# Patient Record
Sex: Male | Born: 1970 | Hispanic: No | Marital: Married | Smoking: Current some day smoker
Health system: Southern US, Community
[De-identification: ages and names within clinical notes are randomized; demographics above are authoritative.]

## PROBLEM LIST (undated history)

## (undated) DIAGNOSIS — F419 Anxiety disorder, unspecified: Secondary | ICD-10-CM

## (undated) DIAGNOSIS — F32A Depression, unspecified: Secondary | ICD-10-CM

## (undated) DIAGNOSIS — F329 Major depressive disorder, single episode, unspecified: Secondary | ICD-10-CM

## (undated) HISTORY — PX: TOTAL HIP ARTHROPLASTY: SHX124

---

## 2004-02-16 ENCOUNTER — Other Ambulatory Visit: Payer: Self-pay

## 2004-05-16 ENCOUNTER — Other Ambulatory Visit: Payer: Self-pay

## 2005-06-22 ENCOUNTER — Other Ambulatory Visit: Payer: Self-pay

## 2005-06-22 ENCOUNTER — Emergency Department: Payer: Self-pay | Admitting: Emergency Medicine

## 2006-06-10 ENCOUNTER — Other Ambulatory Visit: Payer: Self-pay

## 2006-06-10 ENCOUNTER — Emergency Department: Payer: Self-pay | Admitting: Unknown Physician Specialty

## 2007-03-30 ENCOUNTER — Ambulatory Visit: Payer: Self-pay | Admitting: Family Medicine

## 2007-04-07 ENCOUNTER — Ambulatory Visit: Payer: Self-pay | Admitting: Family Medicine

## 2008-04-11 ENCOUNTER — Encounter: Payer: Self-pay | Admitting: Internal Medicine

## 2008-04-25 ENCOUNTER — Encounter: Payer: Self-pay | Admitting: Internal Medicine

## 2008-10-17 ENCOUNTER — Ambulatory Visit: Payer: Self-pay | Admitting: Internal Medicine

## 2008-10-17 ENCOUNTER — Ambulatory Visit: Payer: Self-pay | Admitting: Unknown Physician Specialty

## 2008-10-23 ENCOUNTER — Ambulatory Visit: Payer: Self-pay | Admitting: Unknown Physician Specialty

## 2009-01-29 ENCOUNTER — Ambulatory Visit: Payer: Self-pay | Admitting: Family Medicine

## 2010-02-18 ENCOUNTER — Observation Stay: Payer: Self-pay | Admitting: Internal Medicine

## 2010-02-27 ENCOUNTER — Emergency Department: Payer: Self-pay | Admitting: Unknown Physician Specialty

## 2010-03-01 ENCOUNTER — Inpatient Hospital Stay: Payer: Self-pay | Admitting: Psychiatry

## 2010-03-05 ENCOUNTER — Ambulatory Visit: Payer: Self-pay | Admitting: Unknown Physician Specialty

## 2010-03-13 ENCOUNTER — Ambulatory Visit: Payer: Self-pay | Admitting: Internal Medicine

## 2010-03-16 ENCOUNTER — Inpatient Hospital Stay: Payer: Self-pay | Admitting: Internal Medicine

## 2010-03-18 ENCOUNTER — Inpatient Hospital Stay: Payer: Self-pay | Admitting: Unknown Physician Specialty

## 2010-03-22 ENCOUNTER — Ambulatory Visit: Payer: Self-pay | Admitting: Unknown Physician Specialty

## 2010-03-25 ENCOUNTER — Ambulatory Visit: Payer: Self-pay | Admitting: Unknown Physician Specialty

## 2010-04-25 ENCOUNTER — Ambulatory Visit: Payer: Self-pay | Admitting: Unknown Physician Specialty

## 2010-05-25 ENCOUNTER — Ambulatory Visit: Payer: Self-pay | Admitting: Unknown Physician Specialty

## 2010-06-25 ENCOUNTER — Ambulatory Visit: Payer: Self-pay | Admitting: Unknown Physician Specialty

## 2013-03-15 ENCOUNTER — Ambulatory Visit: Payer: Self-pay

## 2013-10-07 ENCOUNTER — Inpatient Hospital Stay: Payer: Self-pay | Admitting: Psychiatry

## 2013-10-07 LAB — CBC
HCT: 50.4 % (ref 40.0–52.0)
HGB: 17.5 g/dL (ref 13.0–18.0)
MCH: 32.2 pg (ref 26.0–34.0)
MCHC: 34.8 g/dL (ref 32.0–36.0)
MCV: 93 fL (ref 80–100)
Platelet: 195 10*3/uL (ref 150–440)
RBC: 5.43 10*6/uL (ref 4.40–5.90)
RDW: 13.1 % (ref 11.5–14.5)
WBC: 11.6 10*3/uL — AB (ref 3.8–10.6)

## 2013-10-07 LAB — DRUG SCREEN, URINE
AMPHETAMINES, UR SCREEN: NEGATIVE (ref ?–1000)
BENZODIAZEPINE, UR SCRN: NEGATIVE (ref ?–200)
Barbiturates, Ur Screen: NEGATIVE (ref ?–200)
Cannabinoid 50 Ng, Ur ~~LOC~~: NEGATIVE (ref ?–50)
Cocaine Metabolite,Ur ~~LOC~~: NEGATIVE (ref ?–300)
MDMA (Ecstasy)Ur Screen: NEGATIVE (ref ?–500)
METHADONE, UR SCREEN: NEGATIVE (ref ?–300)
Opiate, Ur Screen: POSITIVE (ref ?–300)
Phencyclidine (PCP) Ur S: NEGATIVE (ref ?–25)
Tricyclic, Ur Screen: NEGATIVE (ref ?–1000)

## 2013-10-07 LAB — URINALYSIS, COMPLETE
BILIRUBIN, UR: NEGATIVE
Bacteria: NONE SEEN
Blood: NEGATIVE
Glucose,UR: NEGATIVE mg/dL (ref 0–75)
KETONE: NEGATIVE
LEUKOCYTE ESTERASE: NEGATIVE
Nitrite: NEGATIVE
PH: 5 (ref 4.5–8.0)
Protein: 30
RBC, UR: NONE SEEN /HPF (ref 0–5)
Specific Gravity: 1.029 (ref 1.003–1.030)
Squamous Epithelial: NONE SEEN

## 2013-10-07 LAB — COMPREHENSIVE METABOLIC PANEL
ALBUMIN: 4.1 g/dL (ref 3.4–5.0)
AST: 21 U/L (ref 15–37)
Alkaline Phosphatase: 117 U/L
Anion Gap: 7 (ref 7–16)
BUN: 18 mg/dL (ref 7–18)
Bilirubin,Total: 0.7 mg/dL (ref 0.2–1.0)
CHLORIDE: 101 mmol/L (ref 98–107)
CREATININE: 1.22 mg/dL (ref 0.60–1.30)
Calcium, Total: 9.1 mg/dL (ref 8.5–10.1)
Co2: 28 mmol/L (ref 21–32)
EGFR (African American): >60
EGFR (Non-African Amer.): >60
GLUCOSE: 148 mg/dL — AB (ref 65–99)
Osmolality: 277 (ref 275–301)
Potassium: 3.8 mmol/L (ref 3.5–5.1)
SGPT (ALT): 42 U/L (ref 12–78)
Sodium: 136 mmol/L (ref 136–145)
TOTAL PROTEIN: 7.8 g/dL (ref 6.4–8.2)

## 2013-10-07 LAB — ETHANOL
Ethanol %: <0.003 % (ref 0.000–0.080)
Ethanol: <3 mg/dL

## 2013-10-07 LAB — ACETAMINOPHEN LEVEL

## 2013-10-07 LAB — SALICYLATE LEVEL: Salicylates, Serum: 3.6 mg/dL — ABNORMAL HIGH

## 2014-12-16 NOTE — Discharge Summary (Signed)
PATIENT NAME:  Allen Hogan, [name] MR#:  [id number] DATE OF BIRTH:  1971-01-04  DATE OF ADMISSION:  10/07/2013 DATE OF DISCHARGE:  10/10/2013  HOSPITAL COURSE: See dictated history and physical for details of admission. A 44 year old male with a history of major depression admitted to the hospital with return of severe depressive symptoms with suicidal ideation. In the hospital, the patient did not engage in any dangerous or aggressive behavior. He was cooperative with treatment and was treated with a combination of medicine and psychotherapy. The patient restarted his antidepressant. He was continued on medicines for his COPD and blood pressure and chronic pain. At the time of discharge, his mood was much better. He denied any suicidal ideation. He was very agreeable to follow-up treatment in the community. No sign of psychosis.   DISCHARGE MEDICATIONS: Hydrocodone/acetaminophen tablets 7.5 mg 1 tab 3 times a day as needed for pain, lisinopril 10 mg once a day, Risperdal 4 mg at night, clonazepam 1 mg per day, albuterol inhaler 2 puffs 4 times a day as needed for COPD, fluticasone/salmeterol inhaler 2 times a day for COPD, Prozac 40 mg per day.   MENTAL STATUS EXAM AT DISCHARGE: Neatly dressed and groomed man who looks his stated age, cooperative with the interview. Good eye contact, normal psychomotor activity. Speech normal rate, tone and volume. Affect euthymic, reactive, appropriate. Mood stated as okay. Thoughts lucid. No loosening of associations. Denies auditory or visual hallucinations. Denies suicidal or homicidal ideation. Short-term memory intact. Three out of three objects immediately at three minutes. Long-term memory grossly intact. Normal fund of knowledge. Alert and oriented x 4.   LABORATORY RESULTS: Labs on admission included a drug screen positive for opiates consistent with his medication. Alcohol level negative. Chemistry panel showed a glucose slightly high at 148 on a nonfasting draw.  Hematology panel: Elevated white count 11.6. Urinalysis unremarkable.   DISPOSITION: Discharge home. Follow up with [facility].   DIAGNOSIS, PRINCIPAL AND PRIMARY:  AXIS I: Major depression, recurrent, severe.   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  AXIS II: Deferred.  AXIS III: Chronic obstructive pulmonary disease, chronic pain.  AXIS IV: Severe acute stress from burden of illness.  AXIS V: Functioning at time of discharge: 55.   ____________________________ Audery AmelJohn T. Kamila Broda, MD jtc:sg D: 10/24/2013 23:17:23 ET T: 10/25/2013 08:31:24 ET JOB#: [id number]  cc: Audery AmelJohn T. Endiya Klahr, MD, <Dictator> Audery AmelJOHN T Kaedan Richert MD ELECTRONICALLY SIGNED 10/25/2013 9:59

## 2014-12-16 NOTE — H&P (Signed)
PATIENT NAME:  Allen Hogan, Allen Hogan MR#:  [id number] DATE OF BIRTH:  [birth year]  DATE OF ADMISSION:  10/07/2013  IDENTIFYING INFORMATION AND CHIEF COMPLAINT: A 44 year old man with a history of depression who came to the Emergency Room, referred from [facility].   CHIEF COMPLAINT: "I need to get out of this funky feeling."   HISTORY OF PRESENT ILLNESS: Information obtained from the patient and the chart. He says he is feeling extremely depressed. Mood is sad and down. Energy level is very poor. It has been going on for at least a month and getting progressively worse. He is not clear what might be making it worse although he thinks his current medication is not helping. He currently does not enjoy anything in his life and nothing is making him feel any better. His sleep is excessive and he is tired all the time. His appetite has been decreased. He has been having some fleeting suicidal thoughts without specific plan and intent.   Collateral information from his family suggests that he has been making more serious suicidal statements to them and they said that they would not be surprised if he were to shoot himself.   The patient says that currently he is not having auditory hallucinations, but he has had negative-sounding auditory hallucinations with his depression in the recent past. He denies that he is abusing any drugs or alcohol.   PAST PSYCHIATRIC HISTORY: He has had a previous hospitalization here in 2011 for suicidal ideation and behavior. At that time, he was also abusing cocaine. He says he has been off the drugs for about 2-1/2 years and still gets treatment for depression. He goes to [employer] and has had a community support team, although he is scheduled to stop getting that service soon.   SOCIAL HISTORY: He is married, lives with his wife and 72 year old daughter. The patient is not able to work and has applied for disability. He denies that he is having any problems getting along with his family.    FAMILY HISTORY: Had a brother who had a substance abuse problem and died of it.   PAST MEDICAL HISTORY: The patient reports being diagnosed with avascular necrosis of both hips, has chronic pain in them. Also has high blood pressure, has COPD, smokes still 1/2 pack per day.   REVIEW OF SYSTEMS: Depressed mood consistently all the time, getting worse. Made worse by fatigue. Current medication not helping. Also complains of constant tiredness. Chronic pain, especially in the hips, which is stable. He has suicidal ideation recently. Denies acute auditory or visual hallucinations. Has some chronic shortness of breath. No specific chest pain. No GI complaints. The rest of the 10-point review of systems is negative.   Overweight gentleman, looks older than his stated age. Cooperative with the interview. Eye contact good. Psychomotor activity slow and sluggish. Speech quiet, decreased in total amount. Still easy to understand, not slurred. Affect flattened, not tearful. Mood stated as depressed. Thoughts lucid. No obvious loosening of associations or delusions. Denies auditory or visual hallucinations. Endorses suicidal thoughts. No homicidal ideation. Short-term memory intact. Three out of three objects immediately and at three minutes. Long-term memory grossly intact, normal fund of knowledge. Judgment and insight maintained.   PHYSICAL EXAMINATION:  GENERAL: Overweight.  SKIN: No acute skin lesions.  HEENT: Pupils equal and reactive. Face symmetric. Oral mucosa dry.  NECK AND BACK: Not flexible, (Dictation Anomaly)<<neck hasMISSING TEXT>> poor range of motion in the neck. Tenderness throughout the back area to light palpation.  EXTREMITIES: Decreased range of motion especially in the hips and lower extremities. Fairly normal in the upper extremities. Strength and reflexes normal in upper extremities, slightly decreased bilateral symmetrically in the lower extremities.  NEUROLOGIC: Cranial nerves  symmetric and normal.  LUNGS: Diffusely wheezy, especially, however, on the left side worse than the right side.  ABDOMEN: Nontender. Normal bowel sounds.  HEART: Regular rate and rhythm.  VITAL SIGNS: Current temperature 98.1, pulse 80, respirations 18, blood pressure 150/97.   LABORATORY RESULTS: Drug screen positive for opiates consistent with his prescription medicine and alcohol level negative. Chemistry panel: Elevated glucose 148, otherwise unremarkable. CBC: Elevated white count 11.6. Urinalysis positive protein, no sign of infection. Salicylates slightly elevated at 3.6, not toxic. Acetaminophen negative.   ASSESSMENT: A 44 year old man with a history of major depression, severe, recurrent. Has cocaine dependence, in remission. Has chronic pain issues as well as high blood pressure, possible diabetes. The patient needs hospitalization because of suicidal ideation, past history of suicidal behavior, severity of depression.   TREATMENT PLAN: Admit to psychiatry. Place on suicide precautions. Switch medicines from his current Viibryd to Prozac 40 mg a day. Continue his pain medication, his inhalers, his blood pressure medicine, his aspirin, all as currently prescribed.   Try and get collateral history and involvement from the family if possible. Engage him in groups and activities on the unit for education and support. Continue to monitor mood for improvement.   DIAGNOSIS, PRINCIPAL AND PRIMARY:  AXIS I: Major depression, severe, recurrent.   SECONDARY DIAGNOSIS:  AXIS I: Cocaine dependence, in sustained remission.  AXIS II: No diagnosis.  AXIS III: Chronic pain, obesity, high blood pressure, chronic obstructive pulmonary disease.  AXIS IV: Severe from joblessness, effects of illness.  AXIS V: Functioning at time of evaluation, 30.   ____________________________ Audery AmelJohn T. Channelle Bottger, MD jtc:np D: 10/07/2013 17:17:23 ET T: 10/07/2013 18:21:25 ET JOB#: [id number]  cc: Audery AmelJohn T. Chantille Navarrete, MD,  <Dictator> Audery AmelJOHN T Breionna Punt MD ELECTRONICALLY SIGNED 10/11/2013 11:48

## 2016-07-24 ENCOUNTER — Ambulatory Visit
Admission: EM | Admit: 2016-07-24 | Discharge: 2016-07-24 | Disposition: A | Discharge status: Home / Self Care | Payer: Medicare Other | Attending: Emergency Medicine | Admitting: Emergency Medicine

## 2016-07-24 ENCOUNTER — Ambulatory Visit: Payer: Medicare Other

## 2016-07-24 DIAGNOSIS — J441 Chronic obstructive pulmonary disease with (acute) exacerbation: Secondary | ICD-10-CM

## 2016-07-24 DIAGNOSIS — R918 Other nonspecific abnormal finding of lung field: Secondary | ICD-10-CM | POA: Insufficient documentation

## 2016-07-24 DIAGNOSIS — J181 Lobar pneumonia, unspecified organism: Secondary | ICD-10-CM | POA: Diagnosis not present

## 2016-07-24 DIAGNOSIS — J189 Pneumonia, unspecified organism: Secondary | ICD-10-CM

## 2016-07-24 DIAGNOSIS — J449 Chronic obstructive pulmonary disease, unspecified: Secondary | ICD-10-CM | POA: Diagnosis not present

## 2016-07-24 DIAGNOSIS — R05 Cough: Secondary | ICD-10-CM | POA: Diagnosis not present

## 2016-07-24 HISTORY — DX: Major depressive disorder, single episode, unspecified: F32.9

## 2016-07-24 HISTORY — DX: Anxiety disorder, unspecified: F41.9

## 2016-07-24 HISTORY — DX: Depression, unspecified: F32.A

## 2016-07-24 MED ORDER — IPRATROPIUM-ALBUTEROL 0.5-2.5 (3) MG/3ML IN SOLN
3.0000 mL | Freq: Once | RESPIRATORY_TRACT | Status: AC
  Administered 2016-07-24: 3 mL via RESPIRATORY_TRACT

## 2016-07-24 MED ORDER — PREDNISONE 20 MG PO TABS: ORAL_TABLET | ORAL | 0 refills | Status: DC

## 2016-07-24 MED ORDER — AZITHROMYCIN 500 MG PO TABS: ORAL_TABLET | ORAL | 0 refills | Status: DC

## 2016-07-24 MED ORDER — ALBUTEROL SULFATE HFA 108 (90 BASE) MCG/ACT IN AERS: 1.0000 | INHALATION_SPRAY | Freq: Four times a day (QID) | RESPIRATORY_TRACT | 0 refills | Status: AC | PRN

## 2016-07-24 NOTE — ED Provider Notes (Signed)
CSN: [id number]     Arrival date & time 07/24/16  0900 History   None    Chief Complaint  Patient presents with  . Cough   (Consider location/radiation/quality/duration/timing/severity/associated sxs/prior Treatment) HPI  This a 45 year old male presents with a one-week history of cough congestion and sore throat. She has a history of COPD but continues to smoke. States that the congestion is productive of a yellow sputum. O2 sats are 94% on room air today. Temperature is 98.39F. He is having some wheezing.       Past Medical History:  Diagnosis Date  . Anxiety   . Depression    Past Surgical History:  Procedure Laterality Date  . TOTAL HIP ARTHROPLASTY Right    Family History  Problem Relation Age of Onset  . Hypertension Mother   . Hypertension Father    Social History  Substance Use Topics  . Smoking status: Current Every Day Smoker    Packs/day: 1.00  . Smokeless tobacco: Never Used  . Alcohol use No    Review of Systems  Constitutional: Positive for activity change, chills, fatigue and fever.  HENT: Positive for congestion, postnasal drip, rhinorrhea and sore throat.   Respiratory: Positive for cough, shortness of breath and wheezing. Negative for stridor.   All other systems reviewed and are negative.   Allergies  Patient has no known allergies.  Home Medications   Prior to Admission medications   Medication Sig Start Date End Date Taking? Authorizing Provider  clonazePAM (KLONOPIN) 1 MG tablet Take 1 mg by mouth at bedtime.   Yes Historical Provider, MD  FLUoxetine (PROZAC) 20 MG tablet Take 60 mg by mouth daily.   Yes Historical Provider, MD  ibuprofen (ADVIL,MOTRIN) 400 MG tablet Take 400 mg by mouth 3 (three) times daily.   Yes Historical Provider, MD  risperidone (RISPERDAL) 4 MG tablet Take 4 mg by mouth 2 (two) times daily.   Yes Historical Provider, MD  albuterol (PROVENTIL HFA;VENTOLIN HFA) 108 (90 Base) MCG/ACT inhaler Inhale 1-2 puffs into the  lungs every 6 (six) hours as needed for wheezing or shortness of breath. Use with spacer 07/24/16   Lutricia FeilWilliam P Roemer, PA-C  azithromycin (ZITHROMAX) 500 MG tablet Take 1 tablet (500 mg) daily for 5 days 07/24/16   Lutricia FeilWilliam P Roemer, PA-C  predniSONE (DELTASONE) 20 MG tablet Take 2 tablets (40 mg) daily by mouth 07/24/16   Lutricia FeilWilliam P Roemer, PA-C   Meds Ordered and Administered this Visit   Medications  ipratropium-albuterol (DUONEB) 0.5-2.5 (3) MG/3ML nebulizer solution 3 mL (3 mLs Nebulization Given 07/24/16 1046)    BP 128/85 (BP Location: Right Arm)   Pulse 82   Temp 98.7 F (37.1 C) (Oral)   Resp 16   Ht 5\' 10"  (1.778 m)   Wt 240 lb (108.9 kg)   SpO2 97%   BMI 34.44 kg/m  No data found.   Physical Exam  Constitutional: He is oriented to person, place, and time. He appears well-developed and well-nourished. No distress.  HENT:  Head: Normocephalic and atraumatic.  Right Ear: External ear normal.  Left Ear: External ear normal.  Nose: Nose normal.  Mouth/Throat: Oropharynx is clear and moist. No oropharyngeal exudate.  Eyes: EOM are normal. Pupils are equal, round, and reactive to light. Right eye exhibits no discharge. Left eye exhibits no discharge.  Neck: Normal range of motion. Neck supple.  Pulmonary/Chest: Effort normal. No respiratory distress. He has wheezes.  Patient has non-tussive coarse rhonchi with wheezing throughout all  lung fields  Musculoskeletal: Normal range of motion.  Neurological: He is alert and oriented to person, place, and time.  Skin: Skin is warm and dry. He is not diaphoretic.  Psychiatric: He has a normal mood and affect. His behavior is normal. Judgment and thought content normal.  Nursing note and vitals reviewed.   Urgent Care Course   Clinical Course     Procedures (including critical care time)  Labs Review Labs Reviewed - No data to display  Imaging Review Dg Chest 2 View  Result Date: 07/24/2016 CLINICAL DATA:  Pt has had  somewhat prod cough x 1 week with fever and nausea. Sob with exertion. COPD, current smoker EXAM: CHEST  2 VIEW COMPARISON:  03/16/2010 FINDINGS: Normal mediastinum and cardiac silhouette. Normal pulmonary vasculature. Lateral projection there is increased linear density over the lower thoracic spine. No focal consolidation. No pleural fluid or edema. No pneumothorax per IMPRESSION: Linear density in the lower lobe suggest bronchitis or pneumonia. Followup PA and lateral chest X-ray is recommended in 3-4 weeks following trial of antibiotic therapy to ensure resolution and exclude underlying malignancy. Electronically Signed   By: Genevive BiStewart  Edmunds M.D.   On: 07/24/2016 11:12     Visual Acuity Review  Right Eye Distance:   Left Eye Distance:   Bilateral Distance:    Right Eye Near:   Left Eye Near:    Bilateral Near:     Patient's O2 sat increased to 97% after the DuoNeb treatment. Breath sounds were improved.    MDM   1. Community acquired pneumonia of left lower lobe of lung (HCC)   2. COPD with acute exacerbation [facility](HCC)    Discharge Medication List as of 07/24/2016 11:43 AM    START taking these medications   Details  albuterol (PROVENTIL HFA;VENTOLIN HFA) 108 (90 Base) MCG/ACT inhaler Inhale 1-2 puffs into the lungs every 6 (six) hours as needed for wheezing or shortness of breath. Use with spacer, Starting Thu 07/24/2016, Normal    azithromycin (ZITHROMAX) 500 MG tablet Take 1 tablet (500 mg) daily for 5 days, Normal    predniSONE (DELTASONE) 20 MG tablet Take 2 tablets (40 mg) daily by mouth, Normal      Plan: 1. Test/x-ray results and diagnosis reviewed with patient 2. rx as per orders; risks, benefits, potential side effects reviewed with patient 3. Recommend supportive treatment with Use of inhalers as necessary to clear his breathing. From any sick follow-up in 3-4 weeks with the primary care physician, Dr. Yetta BarreJones, for another x-ray for proof of care. 4. F/u prn if  symptoms worsen or don't improve     Lutricia FeilWilliam P Roemer, PA-C 07/24/16 1218

## 2016-07-24 NOTE — ED Triage Notes (Signed)
Patient complains of cough, congestion, sore throat, fever for 1 week. Patient states that he has tried theraflu, nyquil and dayquil with no relief.

## 2017-04-18 ENCOUNTER — Ambulatory Visit: Payer: Medicare Other

## 2017-04-18 ENCOUNTER — Encounter: Payer: Self-pay | Admitting: Gynecology

## 2017-04-18 ENCOUNTER — Ambulatory Visit
Admission: EM | Admit: 2017-04-18 | Discharge: 2017-04-18 | Disposition: A | Discharge status: Home / Self Care | Payer: Medicare Other | Attending: Family Medicine | Admitting: Family Medicine

## 2017-04-18 DIAGNOSIS — F419 Anxiety disorder, unspecified: Secondary | ICD-10-CM | POA: Insufficient documentation

## 2017-04-18 DIAGNOSIS — S92354A Nondisplaced fracture of fifth metatarsal bone, right foot, initial encounter for closed fracture: Secondary | ICD-10-CM | POA: Diagnosis not present

## 2017-04-18 DIAGNOSIS — I10 Essential (primary) hypertension: Secondary | ICD-10-CM | POA: Insufficient documentation

## 2017-04-18 DIAGNOSIS — W1831XA Fall on same level due to stepping on an object, initial encounter: Secondary | ICD-10-CM | POA: Diagnosis not present

## 2017-04-18 DIAGNOSIS — F17210 Nicotine dependence, cigarettes, uncomplicated: Secondary | ICD-10-CM | POA: Diagnosis not present

## 2017-04-18 DIAGNOSIS — Z79899 Other long term (current) drug therapy: Secondary | ICD-10-CM | POA: Insufficient documentation

## 2017-04-18 DIAGNOSIS — M79671 Pain in right foot: Secondary | ICD-10-CM | POA: Diagnosis present

## 2017-04-18 DIAGNOSIS — F329 Major depressive disorder, single episode, unspecified: Secondary | ICD-10-CM | POA: Insufficient documentation

## 2017-04-18 MED ORDER — OXYCODONE-ACETAMINOPHEN 5-325 MG PO TABS: 1.0000 | ORAL_TABLET | Freq: Three times a day (TID) | ORAL | 0 refills | Status: DC | PRN

## 2017-04-18 NOTE — ED Triage Notes (Signed)
Per patient fell while stepping up x yesterday and injury lrightfoot. Patient c/o right ankle swollen and painful.

## 2017-04-18 NOTE — ED Provider Notes (Addendum)
[facility] ____________________________________________  Time seen: Approximately 10:40 AM  I have reviewed the triage vital signs and the nursing notes.   HISTORY  Chief Complaint Foot Injury   HPI Allen Hogan is a 46 y.o. male  Presenting for evaluation of right foot pain after injury yesterday. Patient reports he was stepping over some rocks and when his foot landed he heard a popping sound and has had pain to the area since. Has been taken both over-the-counter ibuprofen and Tylenol with minimal improvement. No ice or elevation. Has been resting the area. No other alleviating measures attempted. States pain is currently mild, but moderate with direct palpation and attempting to ambulate. States has continued to ambulate but hobbling. Denies pain radiation, paresthesias, decreased range of motion, or other injury. States he fell forward and was able to catch himself with his hands, denies other injury. Reports otherwise feels well. Denies previous injuries or pain to right foot. Reports previous total right hip replacement, states no pain to right hip and states continues a full range of motion without injury from yesterday.  Denies chest pain, shortness of breath, abdominal pain, rash. Denies recent sickness. Denies recent antibiotic use.    Past Medical History:  Diagnosis Date  . Anxiety   . Depression   HTN  There are no active problems to display for this patient.   Past Surgical History:  Procedure Laterality Date  . TOTAL HIP ARTHROPLASTY Right      No current facility-administered medications for this encounter.   Current Outpatient Prescriptions:  .  clonazePAM (KLONOPIN) 1 MG tablet, Take 1 mg by mouth at bedtime., Disp: , Rfl:  .  FLUoxetine (PROZAC) 20 MG tablet, Take 60 mg by mouth daily., Disp: , Rfl:  .  ibuprofen (ADVIL,MOTRIN) 400 MG tablet, Take 400 mg by mouth 3 (three) times daily., Disp: , Rfl:  .  risperidone (RISPERDAL) 4 MG  tablet, Take 4 mg by mouth 2 (two) times daily., Disp: , Rfl:  .  albuterol (PROVENTIL HFA;VENTOLIN HFA) 108 (90 Base) MCG/ACT inhaler, Inhale 1-2 puffs into the lungs every 6 (six) hours as needed for wheezing or shortness of breath. Use    Allergies Patient has no known allergies.  Family History  Problem Relation Age of Onset  . Hypertension Mother   . Hypertension Father     Social History Social History  Substance Use Topics  . Smoking status: Current Every Day Smoker    Packs/day: 1.00  . Smokeless tobacco: Never Used  . Alcohol use Yes     Comment: occasion    Review of Systems Constitutional: No fever/chills Cardiovascular: Denies chest pain. Respiratory: Denies shortness of breath. Gastrointestinal: No abdominal pain.   Musculoskeletal: Negative for back pain. As above.  Skin: Negative for rash.   ____________________________________________   PHYSICAL EXAM:  VITAL SIGNS: ED Triage Vitals  Enc Vitals Group     BP 04/18/17 1022 (!) 142/92     Pulse Rate 04/18/17 1022 95     Resp 04/18/17 1022 16     Temp 04/18/17 1022 98.4 F (36.9 C)     Temp Source 04/18/17 1022 Oral     SpO2 04/18/17 1022 96 %     Weight 04/18/17 1020 270 lb (122.5 kg)     Height 04/18/17 1020 5\' 10"  (1.778 m)     Head Circumference --      Peak Flow --      Pain Score 04/18/17 1020 6  Pain Loc --      Pain Edu? --      Excl. in GC? --     Constitutional: Alert and oriented. Well appearing and in no acute distress. Cardiovascular: Normal rate, regular rhythm. Grossly normal heart sounds.  Good peripheral circulation. Respiratory: Normal respiratory effort without tachypnea nor retractions. Breath sounds are clear and equal bilaterally. No wheezes, rales, rhonchi. Musculoskeletal:  No midline cervical, thoracic or lumbar tenderness to palpation. Bilateral pedal pulses equal and easily palpated. Gait not tested due to pain. Bilateral hips and pelvis nontender. Except: Right  dorsal mid foot at the mid to proximal third fourth and fifth metatarsal moderate tenderness to palpation with mild to moderate local edema, skin intact, no erythema, normal distal sensation and capillary refill to all toes, pain with dorsiflexion, no pain with plantar flexion, ankle nontender. Right lower extremity otherwise nontender. Neurologic:  Normal speech and language.  Speech is normal. No gait instability.  Skin:  Skin is warm, dry. Psychiatric: Mood and affect are normal. Speech and behavior are normal. Patient exhibits appropriate insight and judgment   ___________________________________________   LABS (all labs ordered are listed, but only abnormal results are displayed)  Labs Reviewed - No data to display  RADIOLOGY  Dg Foot Complete Right  Result Date: 04/18/2017 CLINICAL DATA:  Foot pain/injury EXAM: RIGHT FOOT COMPLETE - 3+ VIEW COMPARISON:  None. FINDINGS: Nondisplaced fracture involving the base of the 5th metatarsal. Overlying mild soft tissue swelling. The joint spaces are preserved. IMPRESSION: Nondisplaced fracture involving the base of the 5th metatarsal. Electronically Signed   By: Charline Bills M.D.   On: 04/18/2017 11:31   ____________________________________________   PROCEDURES Procedures   Posterior OCL splint applied by RN. Crutches given.  INITIAL IMPRESSION / ASSESSMENT AND PLAN / ED COURSE  Pertinent labs & imaging results that were available during my care of the patient were reviewed by me and considered in my medical decision making (see chart for details).  Presenting for evaluation of right foot pain post mechanical injury when stepping yesterday. Denies other pain or injuries. Right foot x-ray nondisplaced fracture involving the base of the fifth metatarsal per radiologist, and reviewed by myself. Posterior OCL splint applied and crutches given. Encouraged rest, ice, elevation, nonweightbearing. Encouraged podiatry orthopedic follow-up this  week in the next 3-5 days. Supportive care, over-the-counter ibuprofen and ice as needed. Percocet quantity 9 given as needed for breakthrough pain.Discussed indication, risks and benefits of medications with patient.   [place] controlled substance database reviewed, and most recent controlled medications documented are monthly clonazepam, not other noted.   Discussed follow up with Primary care physician this week. Discussed follow up and return parameters including no resolution or any worsening concerns. Patient verbalized understanding and agreed to plan.   ____________________________________________   FINAL CLINICAL IMPRESSION(S) / ED DIAGNOSES  Final diagnoses:  Nondisplaced fracture of fifth metatarsal bone, right foot, initial encounter for closed fracture     Discharge Medication List as of 04/18/2017 11:47 AM    START taking these medications   Details  oxyCODONE-acetaminophen (ROXICET) 5-325 MG tablet Take 1 tablet by mouth every 8 (eight) hours as needed for moderate pain or severe pain (Do not drive or operate heavy machinery while taking as can cause drowsiness.)., Starting Sat 04/18/2017, Print        Note: This dictation was prepared with Dragon dictation along with smaller phrase technology. Any transcriptional errors that result from this process are unintentional.  Renford Dills, NP 04/18/17 1240

## 2017-04-18 NOTE — Discharge Instructions (Signed)
Take medication as prescribed, and as discussed. Rest. Ice and elevate. Keep in splint and use crutches.Drink plenty of fluids.   Follow up with podiatry this week as discussed. Call Monday morning to schedule.  Follow up with your primary care physician this week as needed. Return to Urgent care for new or worsening concerns.

## 2019-10-07 ENCOUNTER — Ambulatory Visit: Payer: Self-pay | Attending: Internal Medicine

## 2019-10-07 DIAGNOSIS — Z20822 Contact with and (suspected) exposure to covid-19: Secondary | ICD-10-CM

## 2019-10-08 LAB — NOVEL CORONAVIRUS, NAA: SARS-CoV-2, NAA: NOT DETECTED

## 2020-10-06 ENCOUNTER — Ambulatory Visit: Payer: Self-pay

## 2020-12-03 ENCOUNTER — Encounter: Payer: Self-pay | Admitting: Emergency Medicine

## 2020-12-03 ENCOUNTER — Other Ambulatory Visit: Payer: Self-pay

## 2020-12-03 ENCOUNTER — Ambulatory Visit
Admission: EM | Admit: 2020-12-03 | Discharge: 2020-12-03 | Disposition: A | Discharge status: Home / Self Care | Payer: Medicare PPO

## 2020-12-03 DIAGNOSIS — M545 Low back pain, unspecified: Secondary | ICD-10-CM | POA: Diagnosis not present

## 2020-12-03 DIAGNOSIS — J441 Chronic obstructive pulmonary disease with (acute) exacerbation: Secondary | ICD-10-CM

## 2020-12-03 MED ORDER — TIZANIDINE HCL 4 MG PO TABS: 4.0000 mg | ORAL_TABLET | Freq: Three times a day (TID) | ORAL | 0 refills | Status: AC | PRN

## 2020-12-03 MED ORDER — PREDNISONE 10 MG PO TABS: ORAL_TABLET | ORAL | 0 refills | Status: DC

## 2020-12-03 MED ORDER — KETOROLAC TROMETHAMINE 60 MG/2ML IM SOLN
60.0000 mg | Freq: Once | INTRAMUSCULAR | Status: AC
  Administered 2020-12-03: 60 mg via INTRAMUSCULAR

## 2020-12-03 NOTE — ED Provider Notes (Signed)
[facility]    CSN: [id number] Arrival date & time: 12/03/20  1506      History   Chief Complaint Chief Complaint  Patient presents with  . Back Pain    HPI Allen Hogan is a 50 y.o. male presenting for onset of lower back pain this morning.  He denies any injury and states that it has gotten worse throughout the day.  He says the pain is all across the lower back and radiates to the lower abdomen.  He says he does not have any numbness, weakness or tingling.  He says this is consistent with back pain he has had in the past.  He states that he has some disc problems but has never had to have any surgery on his back.  He says he has tried Tylenol and his wife's baclofen without improvement in the pain.  He says the pain is severe.  Not associated with any fever, fatigue, body aches, nausea/vomiting, dysuria or testicular pain.  Patient denies any recent change in activity and again, no injury.  No other complaints or concerns.  HPI  Past Medical History:  Diagnosis Date  . Anxiety   . Depression     There are no problems to display for this patient.   Past Surgical History:  Procedure Laterality Date  . TOTAL HIP ARTHROPLASTY Right        Home Medications    Prior to Admission medications   Medication Sig Start Date End Date Taking? Authorizing Provider  albuterol (PROVENTIL HFA;VENTOLIN HFA) 108 (90 Base) MCG/ACT inhaler Inhale 1-2 puffs into the lungs every 6 (six) hours as needed for wheezing or shortness of breath. Use with spacer 07/24/16  Yes Ovid Curd P, PA-C  ARIPiprazole (ABILIFY) 20 MG tablet Take 1 tablet by mouth daily. 08/27/18  Yes [provider]  atorvastatin (LIPITOR) 20 MG tablet Take 1 tablet by mouth daily. 11/07/19  Yes [provider]  FLUoxetine (PROZAC) 20 MG tablet Take 60 mg by mouth daily.   Yes [provider]  ibuprofen (ADVIL,MOTRIN) 400 MG tablet Take 400 mg by mouth 3 (three) times daily.    Yes [provider]  lisinopril (ZESTRIL) 10 MG tablet Take 1 tablet by mouth every morning. 09/01/20  Yes [provider]  omeprazole (PRILOSEC) 20 MG capsule Take 1 capsule by mouth daily. 11/08/20  Yes [provider]  predniSONE (DELTASONE) 10 MG tablet Take 6 tabs PO x 1 day and decrease by 1 tab daily x 5 days 12/03/20  Yes Eusebio Friendly B, PA-C  tiZANidine (ZANAFLEX) 4 MG tablet Take 1 tablet (4 mg total) by mouth every 8 (eight) hours as needed for up to 7 days for muscle spasms. 12/03/20 12/10/20 Yes Shirlee Latch, PA-C  umeclidinium-vilanterol (ANORO ELLIPTA) 62.5-25 MCG/INH AEPB Inhale into the lungs. 05/28/19  Yes [provider]  risperidone (RISPERDAL) 4 MG tablet Take 4 mg by mouth 2 (two) times daily.    [provider]  clonazePAM (KLONOPIN) 1 MG tablet Take 1 mg by mouth at bedtime.  12/03/20  [provider]    Family History Family History  Problem Relation Age of Onset  . Hypertension Mother   . Hypertension Father     Social History Social History   Tobacco Use  . Smoking status: Current Every Day Smoker    Packs/day: 1.00  . Smokeless tobacco: Never Used  Substance Use Topics  . Alcohol use: Yes    Comment: occasion  .  Drug use: No     Allergies   Patient has no known allergies.   Review of Systems Review of Systems  Constitutional: Negative for fatigue and fever.  Respiratory: Positive for cough (chronic). Negative for shortness of breath.   Cardiovascular: Negative for chest pain.  Genitourinary: Negative for dysuria, flank pain and hematuria.  Musculoskeletal: Positive for back pain. Negative for gait problem, joint swelling and myalgias.  Skin: Negative for color change, rash and wound.  Neurological: Negative for weakness and numbness.     Physical Exam Triage Vital Signs ED Triage Vitals  Enc Vitals Group     BP 12/03/20 1520 131/79     Pulse Rate 12/03/20 1520 92     Resp 12/03/20 1520  18     Temp --      Temp Source 12/03/20 1520 Oral     SpO2 12/03/20 1520 98 %     Weight --      Height 12/03/20 1517 5\' 10"  (1.778 m)     Head Circumference --      Peak Flow --      Pain Score 12/03/20 1517 10     Pain Loc --      Pain Edu? --      Excl. in GC? --    No data found.  Updated Vital Signs BP 131/79 (BP Location: Left Arm)   Pulse 92   Temp 98.3 F (36.8 C) (Oral)   Resp 18   Ht 5\' 10"  (1.778 m)   SpO2 98%   BMI 38.74 kg/m       Physical Exam Vitals and nursing note reviewed.  Constitutional:      General: He is not in acute distress.    Appearance: Normal appearance. He is well-developed. He is not ill-appearing.  HENT:     Head: Normocephalic and atraumatic.  Eyes:     General: No scleral icterus.    Conjunctiva/sclera: Conjunctivae normal.  Cardiovascular:     Rate and Rhythm: Normal rate and regular rhythm.     Heart sounds: Normal heart sounds.  Pulmonary:     Effort: Pulmonary effort is normal. No respiratory distress.     Breath sounds: Rhonchi (diffuse rhonchi throughout) present. No wheezing.  Abdominal:     Palpations: Abdomen is soft.     Tenderness: There is no abdominal tenderness.  Musculoskeletal:     Cervical back: Neck supple.     Lumbar back: Spasms present. No tenderness or bony tenderness. Decreased range of motion (in all directions due to pain and guarding). Negative right straight leg raise test and negative left straight leg raise test.  Skin:    General: Skin is warm and dry.  Neurological:     General: No focal deficit present.     Mental Status: He is alert. Mental status is at baseline.     Motor: No weakness.     Gait: Gait normal.  Psychiatric:        Mood and Affect: Mood normal.        Behavior: Behavior normal.        Thought Content: Thought content normal.      UC Treatments / Results  Labs (all labs ordered are listed, but only abnormal results are displayed) Labs Reviewed - No data to  display  EKG   Radiology No results found.  Procedures Procedures (including critical care time)  Medications Ordered in UC Medications  ketorolac (TORADOL) injection 60 mg (has no administration in  time range)    Initial Impression / Assessment and Plan / UC Course  I have reviewed the triage vital signs and the nursing notes.  Pertinent labs & imaging results that were available during my care of the patient were reviewed by me and considered in my medical decision making (see chart for details).   50 year old male presenting for atraumatic lower back pain that is severe since earlier today.  No red flag signs or symptoms.  No urinary symptoms.  He does have increased pain with movement of his back.  Treating musculoskeletal back pain with prednisone and tizanidine.  Also advised him he can take Tylenol  for pain.  Encouraged use of topical lidocaine pain patches, Salonpas patches, heat and ice.  Advised not to be on bedrest and to follow-up with PCP or Ortho if not improving over the next 1 to 2 weeks.  ED for flag signs and symptoms for back pain reviewed patient.  He does have coarse breath sounds throughout due to COPD.  Oxygen levels 98% he denies any shortness of breath or cough.  He is a current 1 pack/day smoker.  Encouraged him to quit and to follow-up with PCP in regards to getting a daily inhaler.  For now advised him to use albuterol as needed for shortness of breath and return here or go to ED for any acute worsening of his symptoms.   Final Clinical Impressions(s) / UC Diagnoses   Final diagnoses:  Acute bilateral low back pain without sciatica  COPD exacerbation (HCC)     Discharge Instructions     BACK PAIN: Stressed avoiding painful activities . RICE (REST, ICE, COMPRESSION, ELEVATION) guidelines reviewed. May alternate ice and heat. Consider use of muscle rubs, Salonpas patches, etc. Use medications as directed including muscle relaxers if prescribed. Take  anti-inflammatory medications as prescribed or OTC NSAIDs/Tylenol.  F/u with PCP in 7-10 days for reexamination, and please feel free to call or return to the urgent care at any time for any questions or concerns you may have and we will be happy to help you!   BACK PAIN RED FLAGS: If the back pain acutely worsens or there are any red flag symptoms such as numbness/tingling, leg weakness, saddle anesthesia, or loss of bowel/bladder control, go immediately to the ER. Follow up with [place] as scheduled or sooner if the pain does not begin to resolve or if it worsens before the follow up    COPD: You have coarse breath sounds.  This is due to your COPD.  Hopefully the prednisone that I prescribed for your back will also help clear your lungs up.  Use your albuterol as needed for any shortness of breath.  Follow-up with your PCP as you will likely need a daily inhaler to help you breathe better.    ED Prescriptions    Medication Sig Dispense Auth. Provider   predniSONE (DELTASONE) 10 MG tablet Take 6 tabs PO x 1 day and decrease by 1 tab daily x 5 days 21 tablet Eusebio Friendly B, PA-C   tiZANidine (ZANAFLEX) 4 MG tablet Take 1 tablet (4 mg total) by mouth every 8 (eight) hours as needed for up to 7 days for muscle spasms. 20 tablet Shirlee Latch, PA-C     I have reviewed the PDMP during this encounter.   Shirlee Latch, PA-C 12/03/20 1603

## 2020-12-03 NOTE — ED Triage Notes (Signed)
Patient c/o back pain that started this morning. He denies injury.

## 2020-12-03 NOTE — Discharge Instructions (Addendum)
BACK PAIN: Stressed avoiding painful activities . RICE (REST, ICE, COMPRESSION, ELEVATION) guidelines reviewed. May alternate ice and heat. Consider use of muscle rubs, Salonpas patches, etc. Use medications as directed including muscle relaxers if prescribed. Take anti-inflammatory medications as prescribed or OTC NSAIDs/Tylenol.  F/u with PCP in 7-10 days for reexamination, and please feel free to call or return to the urgent care at any time for any questions or concerns you may have and we will be happy to help you!   BACK PAIN RED FLAGS: If the back pain acutely worsens or there are any red flag symptoms such as numbness/tingling, leg weakness, saddle anesthesia, or loss of bowel/bladder control, go immediately to the ER. Follow up with [place] as scheduled or sooner if the pain does not begin to resolve or if it worsens before the follow up    COPD: You have coarse breath sounds.  This is due to your COPD.  Hopefully the prednisone that I prescribed for your back will also help clear your lungs up.  Use your albuterol as needed for any shortness of breath.  Follow-up with your PCP as you will likely need a daily inhaler to help you breathe better.

## 2021-05-17 ENCOUNTER — Encounter: Payer: Self-pay | Admitting: Emergency Medicine

## 2021-05-17 ENCOUNTER — Ambulatory Visit
Admission: EM | Admit: 2021-05-17 | Discharge: 2021-05-17 | Disposition: A | Discharge status: Home / Self Care | Payer: Medicare PPO | Attending: Family Medicine | Admitting: Family Medicine

## 2021-05-17 ENCOUNTER — Other Ambulatory Visit: Payer: Self-pay

## 2021-05-17 ENCOUNTER — Ambulatory Visit (INDEPENDENT_AMBULATORY_CARE_PROVIDER_SITE_OTHER): Payer: Medicare PPO

## 2021-05-17 DIAGNOSIS — S93602A Unspecified sprain of left foot, initial encounter: Secondary | ICD-10-CM

## 2021-05-17 DIAGNOSIS — S93402A Sprain of unspecified ligament of left ankle, initial encounter: Secondary | ICD-10-CM | POA: Diagnosis not present

## 2021-05-17 DIAGNOSIS — M79672 Pain in left foot: Secondary | ICD-10-CM | POA: Diagnosis not present

## 2021-05-17 DIAGNOSIS — M25572 Pain in left ankle and joints of left foot: Secondary | ICD-10-CM | POA: Diagnosis not present

## 2021-05-17 MED ORDER — HYDROCODONE-ACETAMINOPHEN 5-325 MG PO TABS: 1.0000 | ORAL_TABLET | Freq: Four times a day (QID) | ORAL | 0 refills | Status: AC | PRN

## 2021-05-17 NOTE — Discharge Instructions (Addendum)
Your x-ray today did not demonstrate any broken bones.  Wear the Aircast to help support your ankle until the swelling has gone down and the pain has improved.  Use the crutches for weightbearing for the next week while keeping your left foot and ankle elevated is much as possible.  After 1 week you can start bearing weight as tolerated and begin the ankle sprain rehab exercises given your discharge paperwork.  Apply ice for 20s at a time 2-3 times a day for the first 48 hours and then after that apply moist heat for 20 minutes at a time 2-3 times a day to help improve blood flow and then healing.  Use over-the-counter Tylenol and ibuprofen according to the package instructions needed for mild to moderate pain and use the Norco as needed for severe pain.  If your pain increases, your swelling increases, or you do not have any improvement I recommend following up with orthopedics such as [facility] or [place] [facility].

## 2021-05-17 NOTE — ED Triage Notes (Signed)
Patient states that he stepped on his back steps wrong and rolled his left foot about 1 hour ago.  Patient reports swelling in his left ankle.  Patient reports welling and pain in his left foot.

## 2021-05-17 NOTE — ED Provider Notes (Signed)
[facility]    CSN: [id number] Arrival date & time: 05/17/21  1117      History   Chief Complaint Chief Complaint  Patient presents with   Fall   Foot Pain    left    HPI Allen Hogan is a 50 y.o. male.   HPI  50 year old male here for evaluation of left foot and ankle pain.  Patient reports that approximately 2 hours ago he was stepping out of his back door and rolled his left foot and ankle.  Patient states that he is able to bear weight but it is with extreme pain and he is having numbness and tingling of his toes.  Patient states that he is having pain in both his ankle and his foot and both areas are swollen.  There is bruising over the midfoot.  Past Medical History:  Diagnosis Date   Anxiety    Depression     There are no problems to display for this patient.   Past Surgical History:  Procedure Laterality Date   TOTAL HIP ARTHROPLASTY Right        Home Medications    Prior to Admission medications   Medication Sig Start Date End Date Taking? Authorizing Provider  ARIPiprazole (ABILIFY) 20 MG tablet Take 1 tablet by mouth daily. 08/27/18  Yes [provider]  atorvastatin (LIPITOR) 20 MG tablet Take 1 tablet by mouth daily. 11/07/19  Yes [provider]  FLUoxetine (PROZAC) 20 MG tablet Take 60 mg by mouth daily.   Yes [provider]  HYDROcodone-acetaminophen (NORCO/VICODIN) 5-325 MG tablet Take 1-2 tablets by mouth every 6 (six) hours as needed. 05/17/21  Yes Becky Augusta, NP  lisinopril (ZESTRIL) 10 MG tablet Take 1 tablet by mouth every morning. 09/01/20  Yes [provider]  omeprazole (PRILOSEC) 20 MG capsule Take 1 capsule by mouth daily. 11/08/20  Yes [provider]  umeclidinium-vilanterol (ANORO ELLIPTA) 62.5-25 MCG/INH AEPB Inhale into the lungs. 05/28/19  Yes [provider]  albuterol (PROVENTIL HFA;VENTOLIN HFA) 108 (90 Base) MCG/ACT inhaler Inhale 1-2 puffs into the lungs  every 6 (six) hours as needed for wheezing or shortness of breath. Use with spacer 07/24/16   Lutricia Feil, PA-C  ibuprofen (ADVIL,MOTRIN) 400 MG tablet Take 400 mg by mouth 3 (three) times daily.    [provider]  clonazePAM (KLONOPIN) 1 MG tablet Take 1 mg by mouth at bedtime.  12/03/20  [provider]    Family History Family History  Problem Relation Age of Onset   Hypertension Mother    Hypertension Father     Social History Social History   Tobacco Use   Smoking status: Some Days    Packs/day: 1.00    Types: Cigarettes   Smokeless tobacco: Never  Vaping Use   Vaping Use: Never used  Substance Use Topics   Alcohol use: Yes    Comment: occasion   Drug use: No     Allergies   Patient has no known allergies.   Review of Systems Review of Systems  Constitutional:  Negative for fever.  Musculoskeletal:  Positive for arthralgias and joint swelling.  Skin:  Positive for color change.  Neurological:  Positive for numbness.  Hematological: Negative.   Psychiatric/Behavioral: Negative.      Physical Exam Triage Vital Signs ED Triage Vitals  Enc Vitals Group     BP 05/17/21 1134 (!) 153/91     Pulse Rate 05/17/21 1134 84  Resp 05/17/21 1134 16     Temp 05/17/21 1134 97.6 F (36.4 C)     Temp Source 05/17/21 1134 Oral     SpO2 05/17/21 1134 95 %     Weight 05/17/21 1131 288 lb (130.6 kg)     Height 05/17/21 1131 5\' 10"  (1.778 m)     Head Circumference --      Peak Flow --      Pain Score 05/17/21 1130 8     Pain Loc --      Pain Edu? --      Excl. in GC? --    No data found.  Updated Vital Signs BP (!) 153/91 (BP Location: Right Arm)   Pulse 84   Temp 97.6 F (36.4 C) (Oral)   Resp 16   Ht 5\' 10"  (1.778 m)   Wt 288 lb (130.6 kg)   SpO2 95%   BMI 41.32 kg/m   Visual Acuity Right Eye Distance:   Left Eye Distance:   Bilateral Distance:    Right Eye Near:   Left Eye Near:    Bilateral Near:     Physical  Exam Vitals and nursing note reviewed.  Constitutional:      General: He is in acute distress.     Appearance: Normal appearance. He is obese. He is not ill-appearing.  HENT:     Head: Normocephalic and atraumatic.  Musculoskeletal:        General: Swelling and tenderness present. No deformity.  Skin:    General: Skin is warm and dry.     Capillary Refill: Capillary refill takes less than 2 seconds.     Findings: Bruising present.  Neurological:     General: No focal deficit present.     Mental Status: He is alert and oriented to person, place, and time.  Psychiatric:        Mood and Affect: Mood normal.        Behavior: Behavior normal.        Thought Content: Thought content normal.        Judgment: Judgment normal.     UC Treatments / Results  Labs (all labs ordered are listed, but only abnormal results are displayed) Labs Reviewed - No data to display  EKG   Radiology DG Ankle Complete Left  Result Date: 05/17/2021 CLINICAL DATA:  pain and swelling after rolling off of a step. EXAM: LEFT ANKLE COMPLETE - 3+ VIEW COMPARISON:  January 29, 2009 FINDINGS: No acute fracture or dislocation. Degenerative changes of the tibiotalar and fibulotalar joint. Midfoot degenerative changes. Enthesophyte of the Achilles tendon and plantar calcaneus. No area of erosion or osseous destruction. No unexpected radiopaque foreign body. Soft tissues are unremarkable. IMPRESSION: No acute fracture or dislocation. Electronically Signed   By: 05/19/2021 M.D.   On: 05/17/2021 13:16   DG Foot Complete Left  Result Date: 05/17/2021 CLINICAL DATA:  pain and swelling after rolling off of a step. EXAM: LEFT FOOT - COMPLETE 3+ VIEW COMPARISON:  January 29, 2009 FINDINGS: No acute fracture or dislocation. Degenerative changes of the midfoot. Enthesopathic changes of the Achilles tendon, lateral hallux sesamoid and plantar calcaneus. Degenerative changes of the tibiotalar joint. No area of erosion or osseous  destruction. No unexpected radiopaque foreign body. Soft tissues are unremarkable. IMPRESSION: No acute fracture or dislocation. Electronically Signed   By: 05/19/2021 M.D.   On: 05/17/2021 13:14    Procedures Procedures (including critical care time)  Medications Ordered in  UC Medications - No data to display  Initial Impression / Assessment and Plan / UC Course  I have reviewed the triage vital signs and the nursing notes.  Pertinent labs & imaging results that were available during my care of the patient were reviewed by me and considered in my medical decision making (see chart for details).  Patient is a pleasant 50 year old male who does appear to be in a moderate degree of pain here for evaluation of swelling and pain to his left foot and ankle after rolling his foot and ankle on a step earlier today.  Patient states he can bear weight but it is with a great deal of pain and he is complaining of numbness and tingling in his toes.  When asked to wiggle his toes he can wiggle his big toe and his lesser toes to his mild degree.  DP and PT pulses are 2+.  There is swelling and ecchymosis over the proximal lateral midfoot with swelling of both the medial lateral malleolus.  Patient has tenderness with palpation of the distal 6 cm of both medial lateral malleolus.  There is no pain with palpation of the Achilles tendon, or calcaneus.  Patient does have tenderness with palpation of the arch.  Patient has a full sensation of his toes.  Patient exam is concerning for fracture of the ankle or midfoot.  Will obtain radiographs of both the left ankle and left foot.  Left foot x-rays independently reviewed and evaluated by me.  Impression: There is no evidence of fracture or dislocation of the bones of the midfoot with the metatarsals where patient is having pain.  There is some mild soft tissue swelling visible on x-ray.  Awaiting radiology overread.  Left ankle x-rays independently reviewed and  evaluated by me.  Impression: There are degenerative changes in the talus or motor joint.  There is no obvious fracture of the medial lateral malleolus.  On the lateral view there is spurring to the calcaneus.  Soft tissue swelling is also present.  Awaiting radiology overread.  Radiology interpretation of both films is negative for fracture or dislocation.  Will discharge patient home with a diagnosis of left foot and ankle sprain.  Will provide Aircast for stability, have patient elevate his leg is much as possible, and crutches for mobility.  Will give home physical therapy exercises to utilize as well.  Patient use over-the-counter Tylenol and ibuprofen for mild to moderate pain and Norco for severe pain.   Final Clinical Impressions(s) / UC Diagnoses   Final diagnoses:  Moderate left ankle sprain, initial encounter  Foot sprain, left, initial encounter     Discharge Instructions      Your x-ray today did not demonstrate any broken bones.  Wear the Aircast to help support your ankle until the swelling has gone down and the pain has improved.  Use the crutches for weightbearing for the next week while keeping your left foot and ankle elevated is much as possible.  After 1 week you can start bearing weight as tolerated and begin the ankle sprain rehab exercises given your discharge paperwork.  Apply ice for 20s at a time 2-3 times a day for the first 48 hours and then after that apply moist heat for 20 minutes at a time 2-3 times a day to help improve blood flow and then healing.  Use over-the-counter Tylenol and ibuprofen according to the package instructions needed for mild to moderate pain and use the tramadol as needed for  severe pain.  If your pain increases, your swelling increases, or you do not have any improvement I recommend following up with orthopedics such as [facility] or [place].     ED Prescriptions     Medication Sig Dispense Auth. Provider    HYDROcodone-acetaminophen (NORCO/VICODIN) 5-325 MG tablet Take 1-2 tablets by mouth every 6 (six) hours as needed. 15 tablet Becky Augusta, NP      I have reviewed the PDMP during this encounter.   Becky Augusta, NP 05/17/21 1347

## 2022-04-08 IMAGING — CR DG ANKLE COMPLETE 3+V*L*
3 series · 3 of 3 positions shown · non-contrast
Comparison: January 29, 2009

CLINICAL DATA: pain and swelling after rolling off of a step.

EXAM:
LEFT ANKLE COMPLETE - 3+ VIEW

[ankle ap]
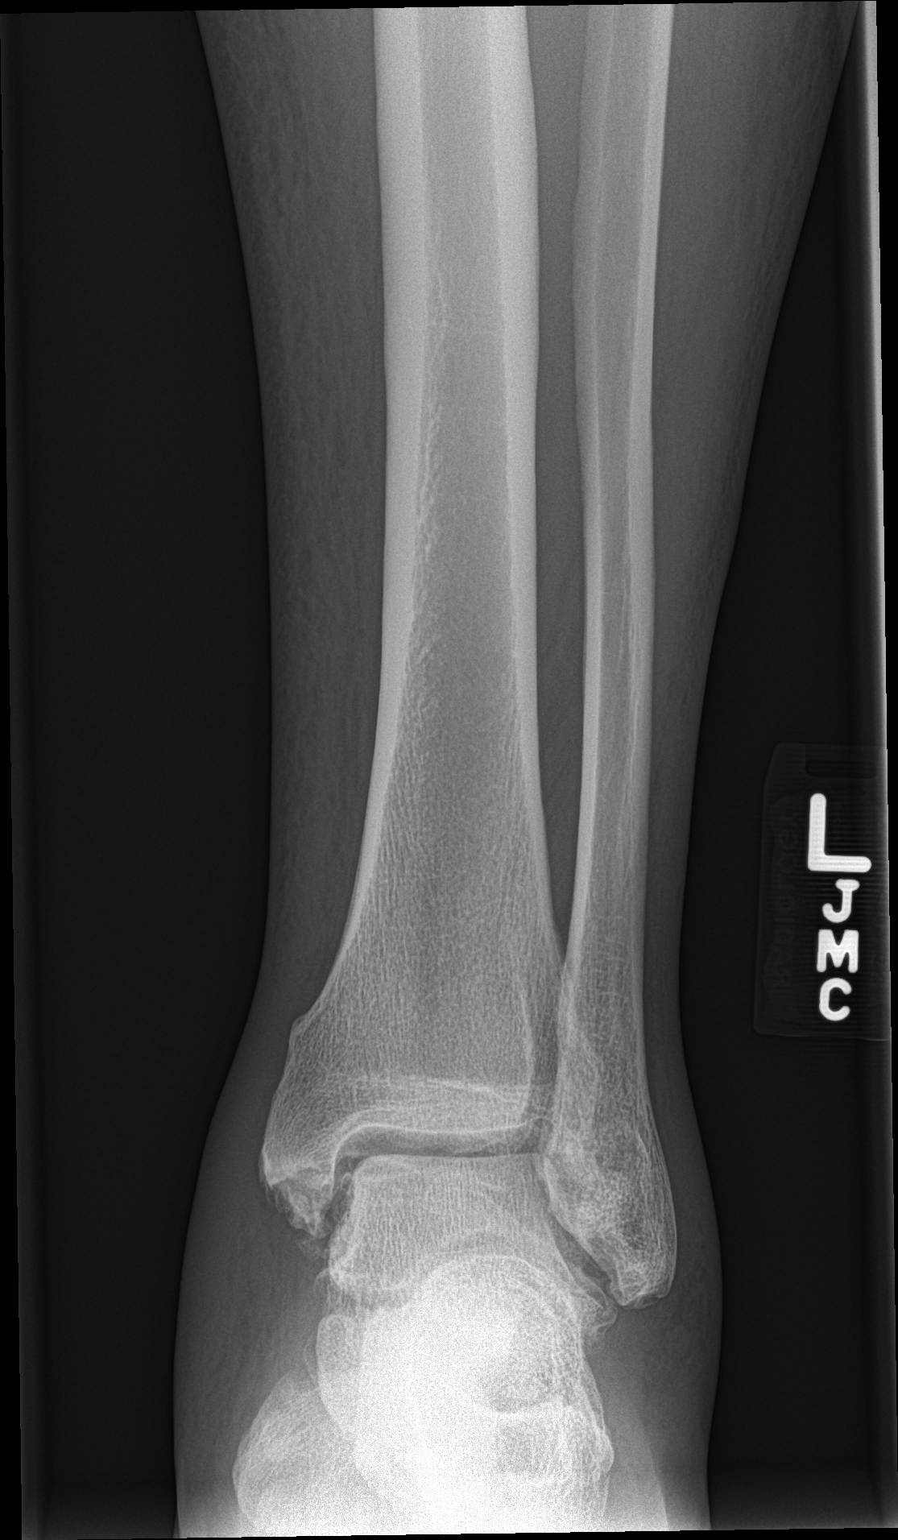

[ankle obl]
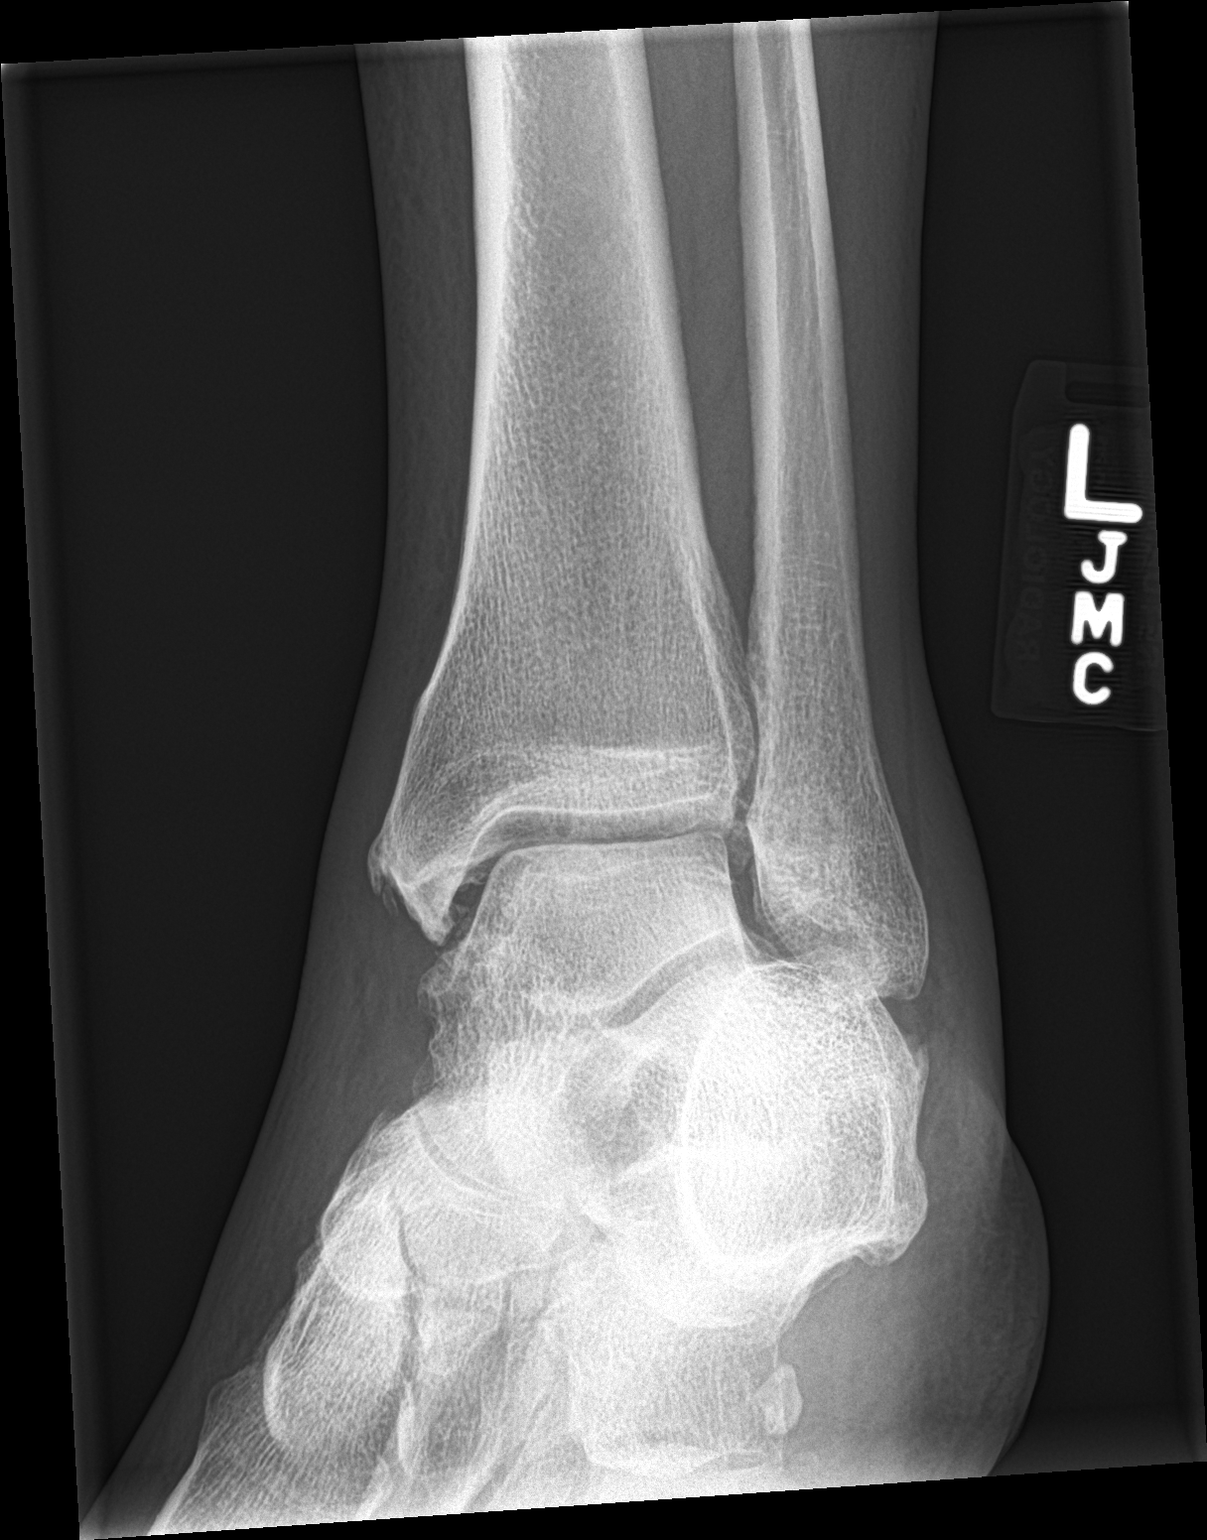

[ankle lat]
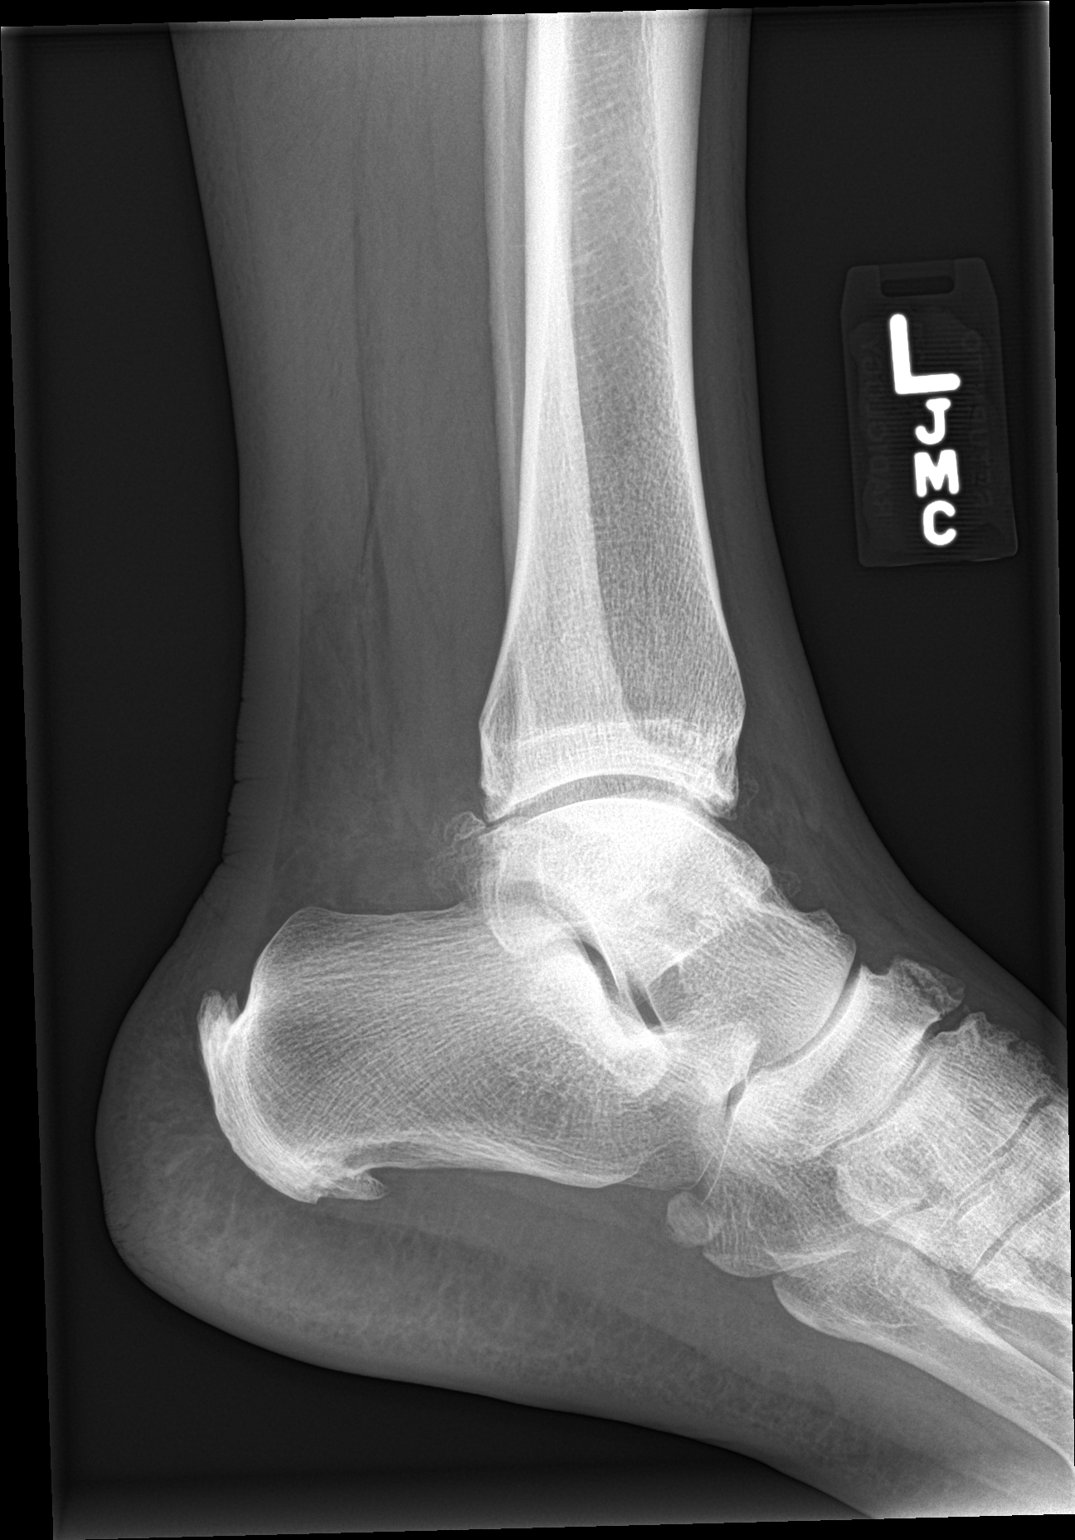

[3 of 3 positions shown; findings below may reference images not displayed]

FINDINGS: No acute fracture or dislocation. Degenerative changes of the
tibiotalar and fibulotalar joint. Midfoot degenerative changes.
Enthesophyte of the Achilles tendon and plantar calcaneus. No area
of erosion or osseous destruction. No unexpected radiopaque foreign
body. Soft tissues are unremarkable.
IMPRESSION: No acute fracture or dislocation.

## 2022-04-08 IMAGING — CR DG FOOT COMPLETE 3+V*L*
3 series · 3 of 3 positions shown · non-contrast
Comparison: January 29, 2009

CLINICAL DATA: pain and swelling after rolling off of a step.

EXAM:
LEFT FOOT - COMPLETE 3+ VIEW

[foot ap]
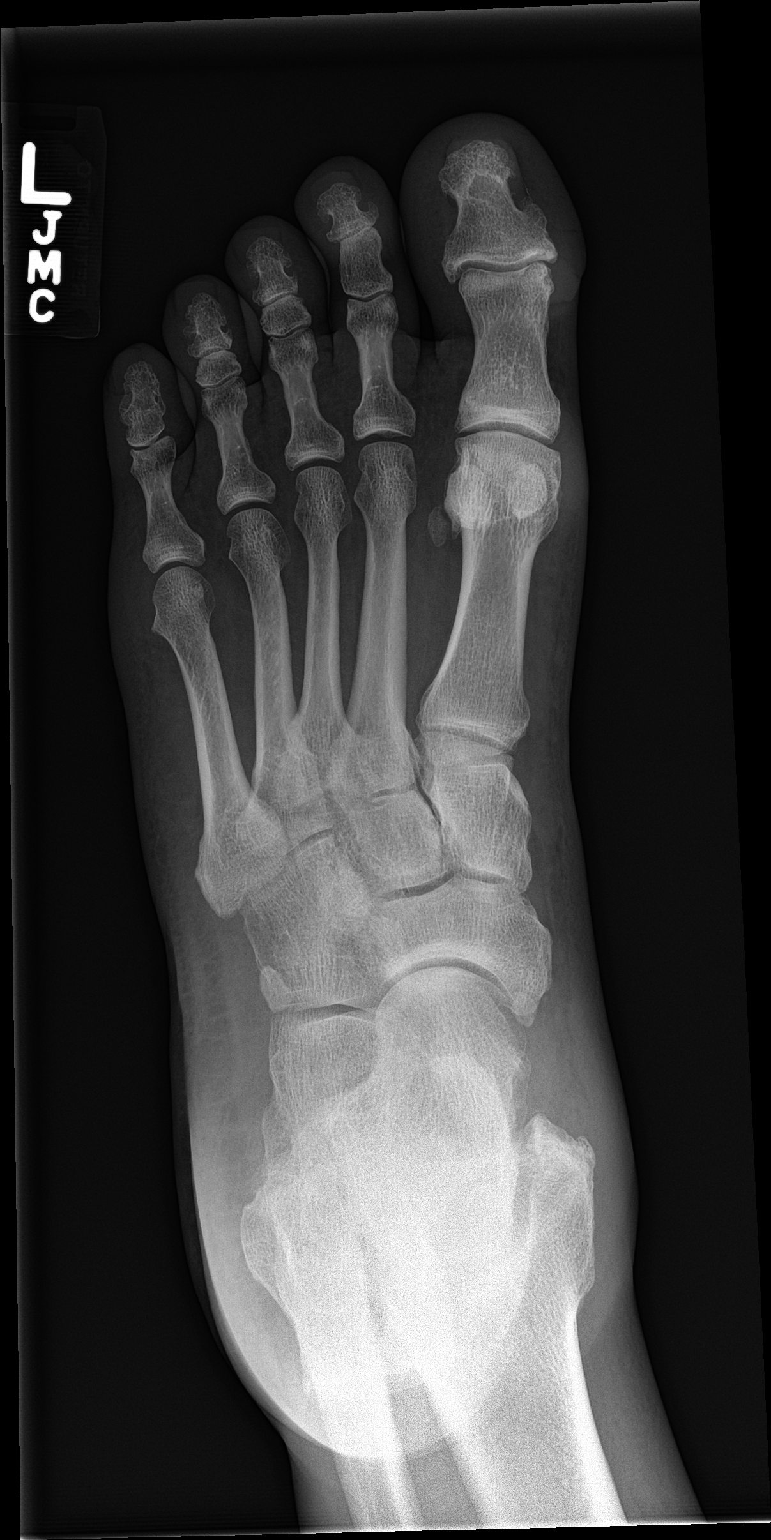

[foot obl]
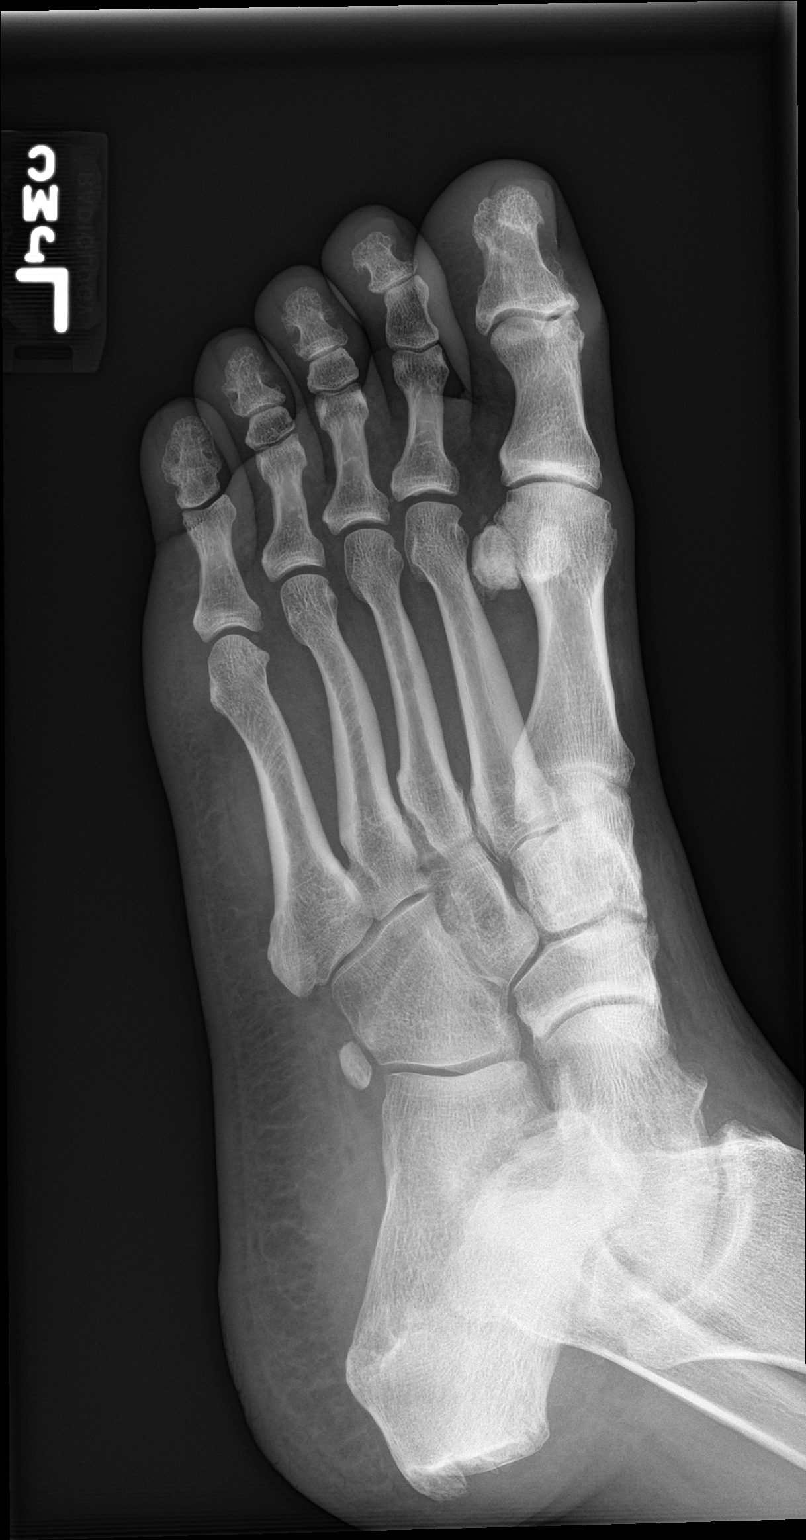

[foot lat]
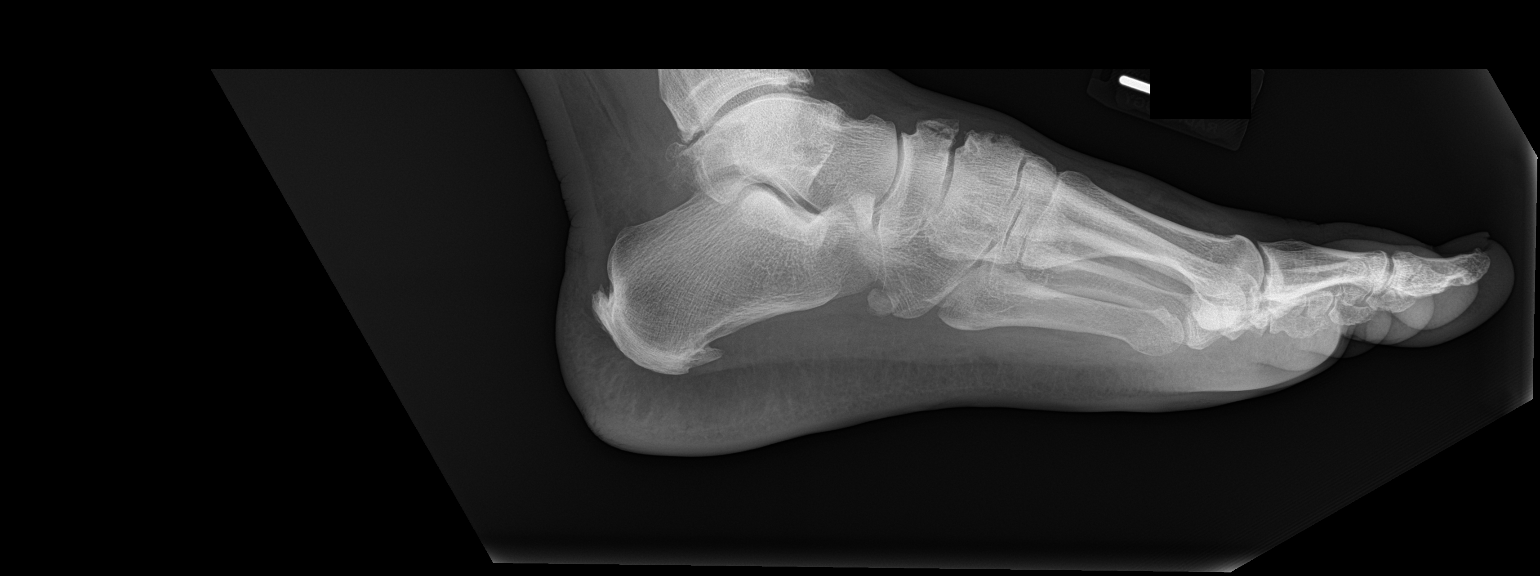

[3 of 3 positions shown; findings below may reference images not displayed]

FINDINGS: No acute fracture or dislocation. Degenerative changes of the
midfoot. Enthesopathic changes of the Achilles tendon, lateral
hallux sesamoid and plantar calcaneus. Degenerative changes of the
tibiotalar joint. No area of erosion or osseous destruction. No
unexpected radiopaque foreign body. Soft tissues are unremarkable.
IMPRESSION: No acute fracture or dislocation.
